# Patient Record
Sex: Female | Born: 1945 | Race: White | Hispanic: No | Marital: Married
Health system: Southern US, Community
[De-identification: ages and names within clinical notes are randomized; demographics above are authoritative.]

---

## 2014-10-04 DIAGNOSIS — I340 Nonrheumatic mitral (valve) insufficiency: Secondary | ICD-10-CM | POA: Diagnosis not present

## 2014-10-04 DIAGNOSIS — I779 Disorder of arteries and arterioles, unspecified: Secondary | ICD-10-CM | POA: Diagnosis not present

## 2014-10-31 DIAGNOSIS — E785 Hyperlipidemia, unspecified: Secondary | ICD-10-CM | POA: Diagnosis not present

## 2014-10-31 DIAGNOSIS — I340 Nonrheumatic mitral (valve) insufficiency: Secondary | ICD-10-CM | POA: Diagnosis not present

## 2014-11-06 DIAGNOSIS — L121 Cicatricial pemphigoid: Secondary | ICD-10-CM | POA: Diagnosis not present

## 2014-11-06 DIAGNOSIS — Z79899 Other long term (current) drug therapy: Secondary | ICD-10-CM | POA: Diagnosis not present

## 2014-11-06 DIAGNOSIS — F17200 Nicotine dependence, unspecified, uncomplicated: Secondary | ICD-10-CM | POA: Diagnosis not present

## 2014-11-29 DIAGNOSIS — I6529 Occlusion and stenosis of unspecified carotid artery: Secondary | ICD-10-CM | POA: Diagnosis not present

## 2014-12-06 DIAGNOSIS — I6523 Occlusion and stenosis of bilateral carotid arteries: Secondary | ICD-10-CM | POA: Diagnosis not present

## 2014-12-06 DIAGNOSIS — Z720 Tobacco use: Secondary | ICD-10-CM | POA: Diagnosis not present

## 2014-12-25 DIAGNOSIS — K5790 Diverticulosis of intestine, part unspecified, without perforation or abscess without bleeding: Secondary | ICD-10-CM | POA: Diagnosis not present

## 2014-12-25 DIAGNOSIS — R748 Abnormal levels of other serum enzymes: Secondary | ICD-10-CM | POA: Diagnosis not present

## 2014-12-25 DIAGNOSIS — R7989 Other specified abnormal findings of blood chemistry: Secondary | ICD-10-CM | POA: Diagnosis not present

## 2014-12-25 DIAGNOSIS — M170 Bilateral primary osteoarthritis of knee: Secondary | ICD-10-CM | POA: Diagnosis not present

## 2014-12-25 DIAGNOSIS — E780 Pure hypercholesterolemia: Secondary | ICD-10-CM | POA: Diagnosis not present

## 2015-05-10 DIAGNOSIS — Z23 Encounter for immunization: Secondary | ICD-10-CM | POA: Diagnosis not present

## 2015-07-10 DIAGNOSIS — Z23 Encounter for immunization: Secondary | ICD-10-CM | POA: Diagnosis not present

## 2015-07-10 DIAGNOSIS — K5790 Diverticulosis of intestine, part unspecified, without perforation or abscess without bleeding: Secondary | ICD-10-CM | POA: Diagnosis not present

## 2015-07-10 DIAGNOSIS — R828 Abnormal findings on cytological and histological examination of urine: Secondary | ICD-10-CM | POA: Diagnosis not present

## 2015-07-10 DIAGNOSIS — Z0000 Encounter for general adult medical examination without abnormal findings: Secondary | ICD-10-CM | POA: Diagnosis not present

## 2015-07-10 DIAGNOSIS — J449 Chronic obstructive pulmonary disease, unspecified: Secondary | ICD-10-CM | POA: Diagnosis not present

## 2015-07-10 DIAGNOSIS — R7989 Other specified abnormal findings of blood chemistry: Secondary | ICD-10-CM | POA: Diagnosis not present

## 2015-07-10 DIAGNOSIS — M899 Disorder of bone, unspecified: Secondary | ICD-10-CM | POA: Diagnosis not present

## 2015-07-10 DIAGNOSIS — R5383 Other fatigue: Secondary | ICD-10-CM | POA: Diagnosis not present

## 2015-07-10 DIAGNOSIS — E7800 Pure hypercholesterolemia, unspecified: Secondary | ICD-10-CM | POA: Diagnosis not present

## 2015-07-10 DIAGNOSIS — M810 Age-related osteoporosis without current pathological fracture: Secondary | ICD-10-CM | POA: Diagnosis not present

## 2015-08-28 DIAGNOSIS — R0902 Hypoxemia: Secondary | ICD-10-CM | POA: Diagnosis not present

## 2015-08-28 DIAGNOSIS — J441 Chronic obstructive pulmonary disease with (acute) exacerbation: Secondary | ICD-10-CM | POA: Diagnosis not present

## 2015-08-28 DIAGNOSIS — J9801 Acute bronchospasm: Secondary | ICD-10-CM | POA: Diagnosis not present

## 2015-08-28 DIAGNOSIS — R0602 Shortness of breath: Secondary | ICD-10-CM | POA: Diagnosis not present

## 2015-08-31 DIAGNOSIS — F17200 Nicotine dependence, unspecified, uncomplicated: Secondary | ICD-10-CM | POA: Diagnosis not present

## 2015-08-31 DIAGNOSIS — Z7951 Long term (current) use of inhaled steroids: Secondary | ICD-10-CM | POA: Diagnosis not present

## 2015-08-31 DIAGNOSIS — F17210 Nicotine dependence, cigarettes, uncomplicated: Secondary | ICD-10-CM | POA: Diagnosis not present

## 2015-08-31 DIAGNOSIS — J9601 Acute respiratory failure with hypoxia: Secondary | ICD-10-CM | POA: Diagnosis not present

## 2015-08-31 DIAGNOSIS — J440 Chronic obstructive pulmonary disease with acute lower respiratory infection: Secondary | ICD-10-CM | POA: Diagnosis not present

## 2015-08-31 DIAGNOSIS — I517 Cardiomegaly: Secondary | ICD-10-CM | POA: Diagnosis not present

## 2015-08-31 DIAGNOSIS — Z90710 Acquired absence of both cervix and uterus: Secondary | ICD-10-CM | POA: Diagnosis not present

## 2015-08-31 DIAGNOSIS — R5381 Other malaise: Secondary | ICD-10-CM | POA: Diagnosis not present

## 2015-08-31 DIAGNOSIS — M109 Gout, unspecified: Secondary | ICD-10-CM | POA: Diagnosis not present

## 2015-08-31 DIAGNOSIS — R739 Hyperglycemia, unspecified: Secondary | ICD-10-CM | POA: Diagnosis not present

## 2015-08-31 DIAGNOSIS — Z7982 Long term (current) use of aspirin: Secondary | ICD-10-CM | POA: Diagnosis not present

## 2015-08-31 DIAGNOSIS — I719 Aortic aneurysm of unspecified site, without rupture: Secondary | ICD-10-CM | POA: Diagnosis not present

## 2015-08-31 DIAGNOSIS — J439 Emphysema, unspecified: Secondary | ICD-10-CM | POA: Diagnosis not present

## 2015-08-31 DIAGNOSIS — Z720 Tobacco use: Secondary | ICD-10-CM | POA: Diagnosis not present

## 2015-08-31 DIAGNOSIS — J9621 Acute and chronic respiratory failure with hypoxia: Secondary | ICD-10-CM | POA: Diagnosis not present

## 2015-08-31 DIAGNOSIS — Z9049 Acquired absence of other specified parts of digestive tract: Secondary | ICD-10-CM | POA: Diagnosis not present

## 2015-08-31 DIAGNOSIS — Z9981 Dependence on supplemental oxygen: Secondary | ICD-10-CM | POA: Diagnosis not present

## 2015-08-31 DIAGNOSIS — I272 Other secondary pulmonary hypertension: Secondary | ICD-10-CM | POA: Diagnosis not present

## 2015-08-31 DIAGNOSIS — J449 Chronic obstructive pulmonary disease, unspecified: Secondary | ICD-10-CM | POA: Diagnosis not present

## 2015-08-31 DIAGNOSIS — Z7952 Long term (current) use of systemic steroids: Secondary | ICD-10-CM | POA: Diagnosis not present

## 2015-08-31 DIAGNOSIS — R0600 Dyspnea, unspecified: Secondary | ICD-10-CM | POA: Diagnosis not present

## 2015-08-31 DIAGNOSIS — E785 Hyperlipidemia, unspecified: Secondary | ICD-10-CM | POA: Diagnosis not present

## 2015-08-31 DIAGNOSIS — I5031 Acute diastolic (congestive) heart failure: Secondary | ICD-10-CM | POA: Diagnosis not present

## 2015-08-31 DIAGNOSIS — J441 Chronic obstructive pulmonary disease with (acute) exacerbation: Secondary | ICD-10-CM | POA: Diagnosis not present

## 2015-08-31 DIAGNOSIS — I5020 Unspecified systolic (congestive) heart failure: Secondary | ICD-10-CM | POA: Diagnosis not present

## 2015-08-31 DIAGNOSIS — J40 Bronchitis, not specified as acute or chronic: Secondary | ICD-10-CM | POA: Diagnosis not present

## 2015-08-31 DIAGNOSIS — R069 Unspecified abnormalities of breathing: Secondary | ICD-10-CM | POA: Diagnosis not present

## 2015-08-31 DIAGNOSIS — I081 Rheumatic disorders of both mitral and tricuspid valves: Secondary | ICD-10-CM | POA: Diagnosis not present

## 2015-08-31 DIAGNOSIS — J189 Pneumonia, unspecified organism: Secondary | ICD-10-CM | POA: Diagnosis not present

## 2015-08-31 DIAGNOSIS — I779 Disorder of arteries and arterioles, unspecified: Secondary | ICD-10-CM | POA: Diagnosis not present

## 2015-08-31 DIAGNOSIS — R0602 Shortness of breath: Secondary | ICD-10-CM | POA: Diagnosis not present

## 2015-08-31 DIAGNOSIS — J431 Panlobular emphysema: Secondary | ICD-10-CM | POA: Diagnosis not present

## 2015-08-31 DIAGNOSIS — J9600 Acute respiratory failure, unspecified whether with hypoxia or hypercapnia: Secondary | ICD-10-CM | POA: Diagnosis not present

## 2015-09-06 DIAGNOSIS — M109 Gout, unspecified: Secondary | ICD-10-CM | POA: Diagnosis not present

## 2015-09-06 DIAGNOSIS — Z792 Long term (current) use of antibiotics: Secondary | ICD-10-CM | POA: Diagnosis not present

## 2015-09-06 DIAGNOSIS — I509 Heart failure, unspecified: Secondary | ICD-10-CM | POA: Diagnosis not present

## 2015-09-06 DIAGNOSIS — J441 Chronic obstructive pulmonary disease with (acute) exacerbation: Secondary | ICD-10-CM | POA: Diagnosis not present

## 2015-09-06 DIAGNOSIS — J440 Chronic obstructive pulmonary disease with acute lower respiratory infection: Secondary | ICD-10-CM | POA: Diagnosis not present

## 2015-09-06 DIAGNOSIS — I719 Aortic aneurysm of unspecified site, without rupture: Secondary | ICD-10-CM | POA: Diagnosis not present

## 2015-09-06 DIAGNOSIS — I272 Other secondary pulmonary hypertension: Secondary | ICD-10-CM | POA: Diagnosis not present

## 2015-09-06 DIAGNOSIS — J189 Pneumonia, unspecified organism: Secondary | ICD-10-CM | POA: Diagnosis not present

## 2015-09-06 DIAGNOSIS — Z9981 Dependence on supplemental oxygen: Secondary | ICD-10-CM | POA: Diagnosis not present

## 2015-09-06 DIAGNOSIS — Z7952 Long term (current) use of systemic steroids: Secondary | ICD-10-CM | POA: Diagnosis not present

## 2015-09-11 DIAGNOSIS — Z7952 Long term (current) use of systemic steroids: Secondary | ICD-10-CM | POA: Diagnosis not present

## 2015-09-11 DIAGNOSIS — M109 Gout, unspecified: Secondary | ICD-10-CM | POA: Diagnosis not present

## 2015-09-11 DIAGNOSIS — Z9981 Dependence on supplemental oxygen: Secondary | ICD-10-CM | POA: Diagnosis not present

## 2015-09-11 DIAGNOSIS — I272 Other secondary pulmonary hypertension: Secondary | ICD-10-CM | POA: Diagnosis not present

## 2015-09-11 DIAGNOSIS — J440 Chronic obstructive pulmonary disease with acute lower respiratory infection: Secondary | ICD-10-CM | POA: Diagnosis not present

## 2015-09-11 DIAGNOSIS — J441 Chronic obstructive pulmonary disease with (acute) exacerbation: Secondary | ICD-10-CM | POA: Diagnosis not present

## 2015-09-11 DIAGNOSIS — R5381 Other malaise: Secondary | ICD-10-CM | POA: Diagnosis not present

## 2015-09-11 DIAGNOSIS — J449 Chronic obstructive pulmonary disease, unspecified: Secondary | ICD-10-CM | POA: Diagnosis not present

## 2015-09-11 DIAGNOSIS — J189 Pneumonia, unspecified organism: Secondary | ICD-10-CM | POA: Diagnosis not present

## 2015-09-11 DIAGNOSIS — Z792 Long term (current) use of antibiotics: Secondary | ICD-10-CM | POA: Diagnosis not present

## 2015-09-11 DIAGNOSIS — I509 Heart failure, unspecified: Secondary | ICD-10-CM | POA: Diagnosis not present

## 2015-09-11 DIAGNOSIS — I719 Aortic aneurysm of unspecified site, without rupture: Secondary | ICD-10-CM | POA: Diagnosis not present

## 2015-09-13 DIAGNOSIS — Z9981 Dependence on supplemental oxygen: Secondary | ICD-10-CM | POA: Diagnosis not present

## 2015-09-13 DIAGNOSIS — R609 Edema, unspecified: Secondary | ICD-10-CM | POA: Diagnosis not present

## 2015-09-13 DIAGNOSIS — I272 Other secondary pulmonary hypertension: Secondary | ICD-10-CM | POA: Diagnosis not present

## 2015-09-13 DIAGNOSIS — J440 Chronic obstructive pulmonary disease with acute lower respiratory infection: Secondary | ICD-10-CM | POA: Diagnosis not present

## 2015-09-13 DIAGNOSIS — I509 Heart failure, unspecified: Secondary | ICD-10-CM | POA: Diagnosis not present

## 2015-09-13 DIAGNOSIS — I719 Aortic aneurysm of unspecified site, without rupture: Secondary | ICD-10-CM | POA: Diagnosis not present

## 2015-09-13 DIAGNOSIS — J189 Pneumonia, unspecified organism: Secondary | ICD-10-CM | POA: Diagnosis not present

## 2015-09-13 DIAGNOSIS — Z792 Long term (current) use of antibiotics: Secondary | ICD-10-CM | POA: Diagnosis not present

## 2015-09-13 DIAGNOSIS — J441 Chronic obstructive pulmonary disease with (acute) exacerbation: Secondary | ICD-10-CM | POA: Diagnosis not present

## 2015-09-13 DIAGNOSIS — Z7952 Long term (current) use of systemic steroids: Secondary | ICD-10-CM | POA: Diagnosis not present

## 2015-09-13 DIAGNOSIS — I340 Nonrheumatic mitral (valve) insufficiency: Secondary | ICD-10-CM | POA: Diagnosis not present

## 2015-09-13 DIAGNOSIS — M109 Gout, unspecified: Secondary | ICD-10-CM | POA: Diagnosis not present

## 2015-09-13 DIAGNOSIS — J439 Emphysema, unspecified: Secondary | ICD-10-CM | POA: Diagnosis not present

## 2015-09-18 DIAGNOSIS — I272 Other secondary pulmonary hypertension: Secondary | ICD-10-CM | POA: Diagnosis not present

## 2015-09-18 DIAGNOSIS — I719 Aortic aneurysm of unspecified site, without rupture: Secondary | ICD-10-CM | POA: Diagnosis not present

## 2015-09-18 DIAGNOSIS — J440 Chronic obstructive pulmonary disease with acute lower respiratory infection: Secondary | ICD-10-CM | POA: Diagnosis not present

## 2015-09-18 DIAGNOSIS — I509 Heart failure, unspecified: Secondary | ICD-10-CM | POA: Diagnosis not present

## 2015-09-18 DIAGNOSIS — M109 Gout, unspecified: Secondary | ICD-10-CM | POA: Diagnosis not present

## 2015-09-18 DIAGNOSIS — J441 Chronic obstructive pulmonary disease with (acute) exacerbation: Secondary | ICD-10-CM | POA: Diagnosis not present

## 2015-09-18 DIAGNOSIS — J189 Pneumonia, unspecified organism: Secondary | ICD-10-CM | POA: Diagnosis not present

## 2015-09-18 DIAGNOSIS — Z792 Long term (current) use of antibiotics: Secondary | ICD-10-CM | POA: Diagnosis not present

## 2015-09-18 DIAGNOSIS — Z7952 Long term (current) use of systemic steroids: Secondary | ICD-10-CM | POA: Diagnosis not present

## 2015-09-18 DIAGNOSIS — Z9981 Dependence on supplemental oxygen: Secondary | ICD-10-CM | POA: Diagnosis not present

## 2015-09-19 DIAGNOSIS — J439 Emphysema, unspecified: Secondary | ICD-10-CM | POA: Diagnosis not present

## 2015-09-19 DIAGNOSIS — J449 Chronic obstructive pulmonary disease, unspecified: Secondary | ICD-10-CM | POA: Diagnosis not present

## 2015-09-19 DIAGNOSIS — M7989 Other specified soft tissue disorders: Secondary | ICD-10-CM | POA: Diagnosis not present

## 2015-09-19 DIAGNOSIS — I340 Nonrheumatic mitral (valve) insufficiency: Secondary | ICD-10-CM | POA: Diagnosis not present

## 2015-09-19 DIAGNOSIS — R600 Localized edema: Secondary | ICD-10-CM | POA: Diagnosis not present

## 2015-09-19 DIAGNOSIS — I6529 Occlusion and stenosis of unspecified carotid artery: Secondary | ICD-10-CM | POA: Diagnosis not present

## 2015-09-19 DIAGNOSIS — I719 Aortic aneurysm of unspecified site, without rupture: Secondary | ICD-10-CM | POA: Diagnosis not present

## 2015-09-19 DIAGNOSIS — Z87891 Personal history of nicotine dependence: Secondary | ICD-10-CM | POA: Diagnosis not present

## 2015-09-19 DIAGNOSIS — J9611 Chronic respiratory failure with hypoxia: Secondary | ICD-10-CM | POA: Diagnosis not present

## 2015-09-20 DIAGNOSIS — J440 Chronic obstructive pulmonary disease with acute lower respiratory infection: Secondary | ICD-10-CM | POA: Diagnosis not present

## 2015-09-20 DIAGNOSIS — I719 Aortic aneurysm of unspecified site, without rupture: Secondary | ICD-10-CM | POA: Diagnosis not present

## 2015-09-20 DIAGNOSIS — I272 Other secondary pulmonary hypertension: Secondary | ICD-10-CM | POA: Diagnosis not present

## 2015-09-20 DIAGNOSIS — J189 Pneumonia, unspecified organism: Secondary | ICD-10-CM | POA: Diagnosis not present

## 2015-09-20 DIAGNOSIS — Z9981 Dependence on supplemental oxygen: Secondary | ICD-10-CM | POA: Diagnosis not present

## 2015-09-20 DIAGNOSIS — M109 Gout, unspecified: Secondary | ICD-10-CM | POA: Diagnosis not present

## 2015-09-20 DIAGNOSIS — I509 Heart failure, unspecified: Secondary | ICD-10-CM | POA: Diagnosis not present

## 2015-09-20 DIAGNOSIS — Z792 Long term (current) use of antibiotics: Secondary | ICD-10-CM | POA: Diagnosis not present

## 2015-09-20 DIAGNOSIS — Z7952 Long term (current) use of systemic steroids: Secondary | ICD-10-CM | POA: Diagnosis not present

## 2015-09-20 DIAGNOSIS — J441 Chronic obstructive pulmonary disease with (acute) exacerbation: Secondary | ICD-10-CM | POA: Diagnosis not present

## 2015-09-21 DIAGNOSIS — R600 Localized edema: Secondary | ICD-10-CM | POA: Diagnosis not present

## 2015-09-27 DIAGNOSIS — R222 Localized swelling, mass and lump, trunk: Secondary | ICD-10-CM | POA: Diagnosis not present

## 2015-09-27 DIAGNOSIS — D649 Anemia, unspecified: Secondary | ICD-10-CM | POA: Diagnosis not present

## 2015-09-27 DIAGNOSIS — I340 Nonrheumatic mitral (valve) insufficiency: Secondary | ICD-10-CM | POA: Diagnosis not present

## 2015-09-27 DIAGNOSIS — R600 Localized edema: Secondary | ICD-10-CM | POA: Diagnosis not present

## 2015-09-27 DIAGNOSIS — I272 Other secondary pulmonary hypertension: Secondary | ICD-10-CM | POA: Diagnosis not present

## 2015-09-27 DIAGNOSIS — R0902 Hypoxemia: Secondary | ICD-10-CM | POA: Diagnosis not present

## 2015-09-27 DIAGNOSIS — J449 Chronic obstructive pulmonary disease, unspecified: Secondary | ICD-10-CM | POA: Diagnosis not present

## 2015-10-02 DIAGNOSIS — I340 Nonrheumatic mitral (valve) insufficiency: Secondary | ICD-10-CM | POA: Diagnosis not present

## 2015-10-02 DIAGNOSIS — J449 Chronic obstructive pulmonary disease, unspecified: Secondary | ICD-10-CM | POA: Diagnosis not present

## 2015-10-02 DIAGNOSIS — R918 Other nonspecific abnormal finding of lung field: Secondary | ICD-10-CM | POA: Diagnosis not present

## 2015-10-02 DIAGNOSIS — J90 Pleural effusion, not elsewhere classified: Secondary | ICD-10-CM | POA: Diagnosis not present

## 2015-10-02 DIAGNOSIS — I714 Abdominal aortic aneurysm, without rupture: Secondary | ICD-10-CM | POA: Diagnosis not present

## 2015-10-02 DIAGNOSIS — R600 Localized edema: Secondary | ICD-10-CM | POA: Diagnosis not present

## 2015-10-02 DIAGNOSIS — R0609 Other forms of dyspnea: Secondary | ICD-10-CM | POA: Diagnosis not present

## 2015-10-05 DIAGNOSIS — I719 Aortic aneurysm of unspecified site, without rupture: Secondary | ICD-10-CM | POA: Diagnosis not present

## 2015-10-05 DIAGNOSIS — I509 Heart failure, unspecified: Secondary | ICD-10-CM | POA: Diagnosis not present

## 2015-10-05 DIAGNOSIS — J441 Chronic obstructive pulmonary disease with (acute) exacerbation: Secondary | ICD-10-CM | POA: Diagnosis not present

## 2015-10-05 DIAGNOSIS — J440 Chronic obstructive pulmonary disease with acute lower respiratory infection: Secondary | ICD-10-CM | POA: Diagnosis not present

## 2015-10-05 DIAGNOSIS — J189 Pneumonia, unspecified organism: Secondary | ICD-10-CM | POA: Diagnosis not present

## 2015-10-05 DIAGNOSIS — I272 Other secondary pulmonary hypertension: Secondary | ICD-10-CM | POA: Diagnosis not present

## 2015-10-05 DIAGNOSIS — Z9981 Dependence on supplemental oxygen: Secondary | ICD-10-CM | POA: Diagnosis not present

## 2015-10-05 DIAGNOSIS — M109 Gout, unspecified: Secondary | ICD-10-CM | POA: Diagnosis not present

## 2015-10-05 DIAGNOSIS — Z792 Long term (current) use of antibiotics: Secondary | ICD-10-CM | POA: Diagnosis not present

## 2015-10-05 DIAGNOSIS — Z7952 Long term (current) use of systemic steroids: Secondary | ICD-10-CM | POA: Diagnosis not present

## 2015-10-05 DIAGNOSIS — J449 Chronic obstructive pulmonary disease, unspecified: Secondary | ICD-10-CM | POA: Diagnosis not present

## 2015-10-08 DIAGNOSIS — M109 Gout, unspecified: Secondary | ICD-10-CM | POA: Diagnosis not present

## 2015-10-08 DIAGNOSIS — Z792 Long term (current) use of antibiotics: Secondary | ICD-10-CM | POA: Diagnosis not present

## 2015-10-08 DIAGNOSIS — I509 Heart failure, unspecified: Secondary | ICD-10-CM | POA: Diagnosis not present

## 2015-10-08 DIAGNOSIS — J189 Pneumonia, unspecified organism: Secondary | ICD-10-CM | POA: Diagnosis not present

## 2015-10-08 DIAGNOSIS — Z7952 Long term (current) use of systemic steroids: Secondary | ICD-10-CM | POA: Diagnosis not present

## 2015-10-08 DIAGNOSIS — I272 Other secondary pulmonary hypertension: Secondary | ICD-10-CM | POA: Diagnosis not present

## 2015-10-08 DIAGNOSIS — I719 Aortic aneurysm of unspecified site, without rupture: Secondary | ICD-10-CM | POA: Diagnosis not present

## 2015-10-08 DIAGNOSIS — Z9981 Dependence on supplemental oxygen: Secondary | ICD-10-CM | POA: Diagnosis not present

## 2015-10-08 DIAGNOSIS — J441 Chronic obstructive pulmonary disease with (acute) exacerbation: Secondary | ICD-10-CM | POA: Diagnosis not present

## 2015-10-08 DIAGNOSIS — J440 Chronic obstructive pulmonary disease with acute lower respiratory infection: Secondary | ICD-10-CM | POA: Diagnosis not present

## 2015-10-17 DIAGNOSIS — J9611 Chronic respiratory failure with hypoxia: Secondary | ICD-10-CM | POA: Diagnosis not present

## 2015-10-17 DIAGNOSIS — J449 Chronic obstructive pulmonary disease, unspecified: Secondary | ICD-10-CM | POA: Diagnosis not present

## 2015-10-18 DIAGNOSIS — I272 Other secondary pulmonary hypertension: Secondary | ICD-10-CM | POA: Diagnosis not present

## 2015-10-18 DIAGNOSIS — J441 Chronic obstructive pulmonary disease with (acute) exacerbation: Secondary | ICD-10-CM | POA: Diagnosis not present

## 2015-10-18 DIAGNOSIS — Z9981 Dependence on supplemental oxygen: Secondary | ICD-10-CM | POA: Diagnosis not present

## 2015-10-18 DIAGNOSIS — J440 Chronic obstructive pulmonary disease with acute lower respiratory infection: Secondary | ICD-10-CM | POA: Diagnosis not present

## 2015-10-18 DIAGNOSIS — I509 Heart failure, unspecified: Secondary | ICD-10-CM | POA: Diagnosis not present

## 2015-10-18 DIAGNOSIS — J189 Pneumonia, unspecified organism: Secondary | ICD-10-CM | POA: Diagnosis not present

## 2015-10-18 DIAGNOSIS — M109 Gout, unspecified: Secondary | ICD-10-CM | POA: Diagnosis not present

## 2015-10-18 DIAGNOSIS — Z7952 Long term (current) use of systemic steroids: Secondary | ICD-10-CM | POA: Diagnosis not present

## 2015-10-18 DIAGNOSIS — Z792 Long term (current) use of antibiotics: Secondary | ICD-10-CM | POA: Diagnosis not present

## 2015-10-18 DIAGNOSIS — I719 Aortic aneurysm of unspecified site, without rupture: Secondary | ICD-10-CM | POA: Diagnosis not present

## 2015-10-26 DIAGNOSIS — Z7952 Long term (current) use of systemic steroids: Secondary | ICD-10-CM | POA: Diagnosis not present

## 2015-10-26 DIAGNOSIS — Z792 Long term (current) use of antibiotics: Secondary | ICD-10-CM | POA: Diagnosis not present

## 2015-10-26 DIAGNOSIS — I272 Other secondary pulmonary hypertension: Secondary | ICD-10-CM | POA: Diagnosis not present

## 2015-10-26 DIAGNOSIS — Z9981 Dependence on supplemental oxygen: Secondary | ICD-10-CM | POA: Diagnosis not present

## 2015-10-26 DIAGNOSIS — J189 Pneumonia, unspecified organism: Secondary | ICD-10-CM | POA: Diagnosis not present

## 2015-10-26 DIAGNOSIS — J441 Chronic obstructive pulmonary disease with (acute) exacerbation: Secondary | ICD-10-CM | POA: Diagnosis not present

## 2015-10-26 DIAGNOSIS — I509 Heart failure, unspecified: Secondary | ICD-10-CM | POA: Diagnosis not present

## 2015-10-26 DIAGNOSIS — M109 Gout, unspecified: Secondary | ICD-10-CM | POA: Diagnosis not present

## 2015-10-26 DIAGNOSIS — J440 Chronic obstructive pulmonary disease with acute lower respiratory infection: Secondary | ICD-10-CM | POA: Diagnosis not present

## 2015-10-26 DIAGNOSIS — I719 Aortic aneurysm of unspecified site, without rupture: Secondary | ICD-10-CM | POA: Diagnosis not present

## 2015-10-30 DIAGNOSIS — I272 Other secondary pulmonary hypertension: Secondary | ICD-10-CM | POA: Diagnosis not present

## 2015-10-30 DIAGNOSIS — J440 Chronic obstructive pulmonary disease with acute lower respiratory infection: Secondary | ICD-10-CM | POA: Diagnosis not present

## 2015-10-30 DIAGNOSIS — I719 Aortic aneurysm of unspecified site, without rupture: Secondary | ICD-10-CM | POA: Diagnosis not present

## 2015-10-30 DIAGNOSIS — I509 Heart failure, unspecified: Secondary | ICD-10-CM | POA: Diagnosis not present

## 2015-10-30 DIAGNOSIS — Z7952 Long term (current) use of systemic steroids: Secondary | ICD-10-CM | POA: Diagnosis not present

## 2015-10-30 DIAGNOSIS — Z9981 Dependence on supplemental oxygen: Secondary | ICD-10-CM | POA: Diagnosis not present

## 2015-10-30 DIAGNOSIS — J441 Chronic obstructive pulmonary disease with (acute) exacerbation: Secondary | ICD-10-CM | POA: Diagnosis not present

## 2015-10-30 DIAGNOSIS — M109 Gout, unspecified: Secondary | ICD-10-CM | POA: Diagnosis not present

## 2015-10-30 DIAGNOSIS — J189 Pneumonia, unspecified organism: Secondary | ICD-10-CM | POA: Diagnosis not present

## 2015-10-30 DIAGNOSIS — Z792 Long term (current) use of antibiotics: Secondary | ICD-10-CM | POA: Diagnosis not present

## 2015-10-31 DIAGNOSIS — I340 Nonrheumatic mitral (valve) insufficiency: Secondary | ICD-10-CM | POA: Diagnosis not present

## 2015-10-31 DIAGNOSIS — I272 Other secondary pulmonary hypertension: Secondary | ICD-10-CM | POA: Diagnosis not present

## 2015-10-31 DIAGNOSIS — J439 Emphysema, unspecified: Secondary | ICD-10-CM | POA: Diagnosis not present

## 2015-10-31 DIAGNOSIS — I6529 Occlusion and stenosis of unspecified carotid artery: Secondary | ICD-10-CM | POA: Diagnosis not present

## 2015-10-31 DIAGNOSIS — I712 Thoracic aortic aneurysm, without rupture: Secondary | ICD-10-CM | POA: Diagnosis not present

## 2015-11-01 DIAGNOSIS — R0602 Shortness of breath: Secondary | ICD-10-CM | POA: Diagnosis not present

## 2015-11-01 DIAGNOSIS — I6529 Occlusion and stenosis of unspecified carotid artery: Secondary | ICD-10-CM | POA: Diagnosis not present

## 2015-11-02 DIAGNOSIS — J449 Chronic obstructive pulmonary disease, unspecified: Secondary | ICD-10-CM | POA: Diagnosis not present

## 2015-11-23 DIAGNOSIS — J449 Chronic obstructive pulmonary disease, unspecified: Secondary | ICD-10-CM | POA: Diagnosis not present

## 2015-11-23 DIAGNOSIS — J90 Pleural effusion, not elsewhere classified: Secondary | ICD-10-CM | POA: Diagnosis not present

## 2015-11-23 DIAGNOSIS — J9611 Chronic respiratory failure with hypoxia: Secondary | ICD-10-CM | POA: Diagnosis not present

## 2015-11-23 DIAGNOSIS — J984 Other disorders of lung: Secondary | ICD-10-CM | POA: Diagnosis not present

## 2015-11-27 DIAGNOSIS — R5383 Other fatigue: Secondary | ICD-10-CM | POA: Diagnosis not present

## 2015-11-27 DIAGNOSIS — R600 Localized edema: Secondary | ICD-10-CM | POA: Diagnosis not present

## 2015-11-27 DIAGNOSIS — R0902 Hypoxemia: Secondary | ICD-10-CM | POA: Diagnosis not present

## 2015-11-27 DIAGNOSIS — D649 Anemia, unspecified: Secondary | ICD-10-CM | POA: Diagnosis not present

## 2015-11-27 DIAGNOSIS — M8580 Other specified disorders of bone density and structure, unspecified site: Secondary | ICD-10-CM | POA: Diagnosis not present

## 2015-11-27 DIAGNOSIS — G4730 Sleep apnea, unspecified: Secondary | ICD-10-CM | POA: Diagnosis not present

## 2015-11-27 DIAGNOSIS — I6529 Occlusion and stenosis of unspecified carotid artery: Secondary | ICD-10-CM | POA: Diagnosis not present

## 2015-11-28 DIAGNOSIS — I6523 Occlusion and stenosis of bilateral carotid arteries: Secondary | ICD-10-CM | POA: Diagnosis not present

## 2015-12-03 DIAGNOSIS — J449 Chronic obstructive pulmonary disease, unspecified: Secondary | ICD-10-CM | POA: Diagnosis not present

## 2015-12-05 DIAGNOSIS — I272 Other secondary pulmonary hypertension: Secondary | ICD-10-CM | POA: Diagnosis not present

## 2015-12-05 DIAGNOSIS — G4733 Obstructive sleep apnea (adult) (pediatric): Secondary | ICD-10-CM | POA: Diagnosis not present

## 2015-12-05 DIAGNOSIS — I340 Nonrheumatic mitral (valve) insufficiency: Secondary | ICD-10-CM | POA: Diagnosis not present

## 2015-12-05 DIAGNOSIS — I7300 Raynaud's syndrome without gangrene: Secondary | ICD-10-CM | POA: Diagnosis not present

## 2015-12-05 DIAGNOSIS — J9611 Chronic respiratory failure with hypoxia: Secondary | ICD-10-CM | POA: Diagnosis not present

## 2015-12-05 DIAGNOSIS — J449 Chronic obstructive pulmonary disease, unspecified: Secondary | ICD-10-CM | POA: Diagnosis not present

## 2015-12-05 DIAGNOSIS — R0602 Shortness of breath: Secondary | ICD-10-CM | POA: Diagnosis not present

## 2015-12-12 DIAGNOSIS — I6523 Occlusion and stenosis of bilateral carotid arteries: Secondary | ICD-10-CM | POA: Diagnosis not present

## 2016-01-02 DIAGNOSIS — J449 Chronic obstructive pulmonary disease, unspecified: Secondary | ICD-10-CM | POA: Diagnosis not present

## 2016-01-09 DIAGNOSIS — D649 Anemia, unspecified: Secondary | ICD-10-CM | POA: Diagnosis not present

## 2016-01-09 DIAGNOSIS — E7800 Pure hypercholesterolemia, unspecified: Secondary | ICD-10-CM | POA: Diagnosis not present

## 2016-01-09 DIAGNOSIS — M810 Age-related osteoporosis without current pathological fracture: Secondary | ICD-10-CM | POA: Diagnosis not present

## 2016-01-09 DIAGNOSIS — J449 Chronic obstructive pulmonary disease, unspecified: Secondary | ICD-10-CM | POA: Diagnosis not present

## 2016-01-16 DIAGNOSIS — I272 Other secondary pulmonary hypertension: Secondary | ICD-10-CM | POA: Diagnosis not present

## 2016-01-16 DIAGNOSIS — G4733 Obstructive sleep apnea (adult) (pediatric): Secondary | ICD-10-CM | POA: Diagnosis not present

## 2016-01-16 DIAGNOSIS — J9611 Chronic respiratory failure with hypoxia: Secondary | ICD-10-CM | POA: Diagnosis not present

## 2016-01-16 DIAGNOSIS — Z720 Tobacco use: Secondary | ICD-10-CM | POA: Diagnosis not present

## 2016-01-16 DIAGNOSIS — J449 Chronic obstructive pulmonary disease, unspecified: Secondary | ICD-10-CM | POA: Diagnosis not present

## 2016-01-21 DIAGNOSIS — J441 Chronic obstructive pulmonary disease with (acute) exacerbation: Secondary | ICD-10-CM | POA: Diagnosis not present

## 2016-01-21 DIAGNOSIS — Z87891 Personal history of nicotine dependence: Secondary | ICD-10-CM | POA: Diagnosis not present

## 2016-01-21 DIAGNOSIS — E785 Hyperlipidemia, unspecified: Secondary | ICD-10-CM | POA: Diagnosis not present

## 2016-01-21 DIAGNOSIS — L121 Cicatricial pemphigoid: Secondary | ICD-10-CM | POA: Diagnosis not present

## 2016-01-21 DIAGNOSIS — I272 Other secondary pulmonary hypertension: Secondary | ICD-10-CM | POA: Diagnosis not present

## 2016-01-21 DIAGNOSIS — Z79899 Other long term (current) drug therapy: Secondary | ICD-10-CM | POA: Diagnosis not present

## 2016-01-21 DIAGNOSIS — J449 Chronic obstructive pulmonary disease, unspecified: Secondary | ICD-10-CM | POA: Diagnosis not present

## 2016-02-02 DIAGNOSIS — J449 Chronic obstructive pulmonary disease, unspecified: Secondary | ICD-10-CM | POA: Diagnosis not present

## 2016-02-26 DIAGNOSIS — I340 Nonrheumatic mitral (valve) insufficiency: Secondary | ICD-10-CM | POA: Diagnosis not present

## 2016-02-26 DIAGNOSIS — I272 Other secondary pulmonary hypertension: Secondary | ICD-10-CM | POA: Diagnosis not present

## 2016-02-26 DIAGNOSIS — I712 Thoracic aortic aneurysm, without rupture: Secondary | ICD-10-CM | POA: Diagnosis not present

## 2016-02-26 DIAGNOSIS — J439 Emphysema, unspecified: Secondary | ICD-10-CM | POA: Diagnosis not present

## 2016-02-26 DIAGNOSIS — I6529 Occlusion and stenosis of unspecified carotid artery: Secondary | ICD-10-CM | POA: Diagnosis not present

## 2016-03-03 DIAGNOSIS — J449 Chronic obstructive pulmonary disease, unspecified: Secondary | ICD-10-CM | POA: Diagnosis not present

## 2016-03-10 DIAGNOSIS — R768 Other specified abnormal immunological findings in serum: Secondary | ICD-10-CM | POA: Diagnosis not present

## 2016-03-10 DIAGNOSIS — I272 Other secondary pulmonary hypertension: Secondary | ICD-10-CM | POA: Diagnosis not present

## 2016-04-03 DIAGNOSIS — J449 Chronic obstructive pulmonary disease, unspecified: Secondary | ICD-10-CM | POA: Diagnosis not present

## 2016-05-04 DIAGNOSIS — J449 Chronic obstructive pulmonary disease, unspecified: Secondary | ICD-10-CM | POA: Diagnosis not present

## 2016-05-06 DIAGNOSIS — J449 Chronic obstructive pulmonary disease, unspecified: Secondary | ICD-10-CM | POA: Diagnosis not present

## 2016-05-06 DIAGNOSIS — Z720 Tobacco use: Secondary | ICD-10-CM | POA: Diagnosis not present

## 2016-05-06 DIAGNOSIS — J439 Emphysema, unspecified: Secondary | ICD-10-CM | POA: Diagnosis not present

## 2016-05-06 DIAGNOSIS — I272 Other secondary pulmonary hypertension: Secondary | ICD-10-CM | POA: Diagnosis not present

## 2016-06-03 DIAGNOSIS — J449 Chronic obstructive pulmonary disease, unspecified: Secondary | ICD-10-CM | POA: Diagnosis not present

## 2016-07-04 DIAGNOSIS — J449 Chronic obstructive pulmonary disease, unspecified: Secondary | ICD-10-CM | POA: Diagnosis not present

## 2016-07-14 ENCOUNTER — Other Ambulatory Visit: Payer: Self-pay | Admitting: Unknown Physician Specialty

## 2016-07-14 DIAGNOSIS — R109 Unspecified abdominal pain: Secondary | ICD-10-CM

## 2016-07-15 ENCOUNTER — Ambulatory Visit (INDEPENDENT_AMBULATORY_CARE_PROVIDER_SITE_OTHER): Payer: Medicare Other

## 2016-07-15 DIAGNOSIS — R109 Unspecified abdominal pain: Secondary | ICD-10-CM

## 2016-07-15 DIAGNOSIS — K5900 Constipation, unspecified: Secondary | ICD-10-CM | POA: Diagnosis not present

## 2016-07-15 DIAGNOSIS — I700 Atherosclerosis of aorta: Secondary | ICD-10-CM | POA: Diagnosis not present

## 2016-07-15 DIAGNOSIS — R140 Abdominal distension (gaseous): Secondary | ICD-10-CM | POA: Diagnosis not present

## 2016-07-15 DIAGNOSIS — J449 Chronic obstructive pulmonary disease, unspecified: Secondary | ICD-10-CM | POA: Diagnosis not present

## 2016-08-01 DIAGNOSIS — R828 Abnormal findings on cytological and histological examination of urine: Secondary | ICD-10-CM | POA: Diagnosis not present

## 2016-08-01 DIAGNOSIS — K5790 Diverticulosis of intestine, part unspecified, without perforation or abscess without bleeding: Secondary | ICD-10-CM | POA: Diagnosis not present

## 2016-08-01 DIAGNOSIS — J449 Chronic obstructive pulmonary disease, unspecified: Secondary | ICD-10-CM | POA: Diagnosis not present

## 2016-08-01 DIAGNOSIS — M810 Age-related osteoporosis without current pathological fracture: Secondary | ICD-10-CM | POA: Diagnosis not present

## 2016-08-01 DIAGNOSIS — Z0000 Encounter for general adult medical examination without abnormal findings: Secondary | ICD-10-CM | POA: Diagnosis not present

## 2016-08-01 DIAGNOSIS — M899 Disorder of bone, unspecified: Secondary | ICD-10-CM | POA: Diagnosis not present

## 2016-08-01 DIAGNOSIS — I6529 Occlusion and stenosis of unspecified carotid artery: Secondary | ICD-10-CM | POA: Diagnosis not present

## 2016-08-03 DIAGNOSIS — J449 Chronic obstructive pulmonary disease, unspecified: Secondary | ICD-10-CM | POA: Diagnosis not present

## 2016-08-05 DIAGNOSIS — J9611 Chronic respiratory failure with hypoxia: Secondary | ICD-10-CM | POA: Diagnosis not present

## 2016-08-05 DIAGNOSIS — J441 Chronic obstructive pulmonary disease with (acute) exacerbation: Secondary | ICD-10-CM | POA: Diagnosis not present

## 2016-08-05 DIAGNOSIS — J449 Chronic obstructive pulmonary disease, unspecified: Secondary | ICD-10-CM | POA: Diagnosis not present

## 2016-08-05 DIAGNOSIS — Z720 Tobacco use: Secondary | ICD-10-CM | POA: Diagnosis not present

## 2016-08-05 DIAGNOSIS — I272 Pulmonary hypertension, unspecified: Secondary | ICD-10-CM | POA: Diagnosis not present

## 2016-08-20 DIAGNOSIS — Z9981 Dependence on supplemental oxygen: Secondary | ICD-10-CM | POA: Diagnosis not present

## 2016-08-20 DIAGNOSIS — Z7982 Long term (current) use of aspirin: Secondary | ICD-10-CM | POA: Diagnosis not present

## 2016-08-20 DIAGNOSIS — J432 Centrilobular emphysema: Secondary | ICD-10-CM | POA: Diagnosis not present

## 2016-08-20 DIAGNOSIS — J180 Bronchopneumonia, unspecified organism: Secondary | ICD-10-CM | POA: Diagnosis not present

## 2016-08-20 DIAGNOSIS — Z79899 Other long term (current) drug therapy: Secondary | ICD-10-CM | POA: Diagnosis not present

## 2016-08-20 DIAGNOSIS — R7881 Bacteremia: Secondary | ICD-10-CM | POA: Diagnosis not present

## 2016-08-20 DIAGNOSIS — J9621 Acute and chronic respiratory failure with hypoxia: Secondary | ICD-10-CM | POA: Diagnosis not present

## 2016-08-20 DIAGNOSIS — I714 Abdominal aortic aneurysm, without rupture: Secondary | ICD-10-CM | POA: Diagnosis not present

## 2016-08-20 DIAGNOSIS — R05 Cough: Secondary | ICD-10-CM | POA: Diagnosis not present

## 2016-08-20 DIAGNOSIS — R0902 Hypoxemia: Secondary | ICD-10-CM | POA: Diagnosis not present

## 2016-08-20 DIAGNOSIS — J440 Chronic obstructive pulmonary disease with acute lower respiratory infection: Secondary | ICD-10-CM | POA: Diagnosis not present

## 2016-08-20 DIAGNOSIS — J441 Chronic obstructive pulmonary disease with (acute) exacerbation: Secondary | ICD-10-CM | POA: Diagnosis not present

## 2016-08-20 DIAGNOSIS — Z87891 Personal history of nicotine dependence: Secondary | ICD-10-CM | POA: Diagnosis not present

## 2016-08-20 DIAGNOSIS — I517 Cardiomegaly: Secondary | ICD-10-CM | POA: Diagnosis not present

## 2016-08-20 DIAGNOSIS — R509 Fever, unspecified: Secondary | ICD-10-CM | POA: Diagnosis not present

## 2016-08-20 DIAGNOSIS — I081 Rheumatic disorders of both mitral and tricuspid valves: Secondary | ICD-10-CM | POA: Diagnosis not present

## 2016-08-20 DIAGNOSIS — E785 Hyperlipidemia, unspecified: Secondary | ICD-10-CM | POA: Diagnosis not present

## 2016-08-20 DIAGNOSIS — J40 Bronchitis, not specified as acute or chronic: Secondary | ICD-10-CM | POA: Diagnosis not present

## 2016-08-20 DIAGNOSIS — I272 Pulmonary hypertension, unspecified: Secondary | ICD-10-CM | POA: Diagnosis not present

## 2016-09-03 DIAGNOSIS — J449 Chronic obstructive pulmonary disease, unspecified: Secondary | ICD-10-CM | POA: Diagnosis not present

## 2016-09-04 DIAGNOSIS — Z7982 Long term (current) use of aspirin: Secondary | ICD-10-CM | POA: Diagnosis not present

## 2016-09-04 DIAGNOSIS — Z9981 Dependence on supplemental oxygen: Secondary | ICD-10-CM | POA: Diagnosis not present

## 2016-09-04 DIAGNOSIS — J9601 Acute respiratory failure with hypoxia: Secondary | ICD-10-CM | POA: Diagnosis not present

## 2016-09-04 DIAGNOSIS — J441 Chronic obstructive pulmonary disease with (acute) exacerbation: Secondary | ICD-10-CM | POA: Diagnosis not present

## 2016-09-04 DIAGNOSIS — E785 Hyperlipidemia, unspecified: Secondary | ICD-10-CM | POA: Diagnosis not present

## 2016-09-04 DIAGNOSIS — I272 Pulmonary hypertension, unspecified: Secondary | ICD-10-CM | POA: Diagnosis not present

## 2016-09-04 DIAGNOSIS — I712 Thoracic aortic aneurysm, without rupture: Secondary | ICD-10-CM | POA: Diagnosis not present

## 2016-09-04 DIAGNOSIS — Z87891 Personal history of nicotine dependence: Secondary | ICD-10-CM | POA: Diagnosis not present

## 2016-09-04 DIAGNOSIS — R0602 Shortness of breath: Secondary | ICD-10-CM | POA: Diagnosis not present

## 2016-09-04 DIAGNOSIS — Z79899 Other long term (current) drug therapy: Secondary | ICD-10-CM | POA: Diagnosis not present

## 2016-09-04 DIAGNOSIS — J069 Acute upper respiratory infection, unspecified: Secondary | ICD-10-CM | POA: Diagnosis not present

## 2016-09-04 DIAGNOSIS — J439 Emphysema, unspecified: Secondary | ICD-10-CM | POA: Diagnosis not present

## 2016-09-04 DIAGNOSIS — I270 Primary pulmonary hypertension: Secondary | ICD-10-CM | POA: Diagnosis not present

## 2016-09-04 DIAGNOSIS — R05 Cough: Secondary | ICD-10-CM | POA: Diagnosis not present

## 2016-09-04 DIAGNOSIS — J449 Chronic obstructive pulmonary disease, unspecified: Secondary | ICD-10-CM | POA: Diagnosis not present

## 2016-10-02 DIAGNOSIS — J441 Chronic obstructive pulmonary disease with (acute) exacerbation: Secondary | ICD-10-CM | POA: Diagnosis not present

## 2016-10-02 DIAGNOSIS — J439 Emphysema, unspecified: Secondary | ICD-10-CM | POA: Diagnosis not present

## 2016-10-02 DIAGNOSIS — J40 Bronchitis, not specified as acute or chronic: Secondary | ICD-10-CM | POA: Diagnosis not present

## 2016-10-02 DIAGNOSIS — R600 Localized edema: Secondary | ICD-10-CM | POA: Diagnosis not present

## 2016-10-02 DIAGNOSIS — J9611 Chronic respiratory failure with hypoxia: Secondary | ICD-10-CM | POA: Diagnosis not present

## 2016-10-04 DIAGNOSIS — J449 Chronic obstructive pulmonary disease, unspecified: Secondary | ICD-10-CM | POA: Diagnosis not present

## 2016-10-28 DIAGNOSIS — H7292 Unspecified perforation of tympanic membrane, left ear: Secondary | ICD-10-CM | POA: Diagnosis not present

## 2016-10-28 DIAGNOSIS — J9611 Chronic respiratory failure with hypoxia: Secondary | ICD-10-CM | POA: Diagnosis not present

## 2016-10-28 DIAGNOSIS — J449 Chronic obstructive pulmonary disease, unspecified: Secondary | ICD-10-CM | POA: Diagnosis not present

## 2016-10-30 DIAGNOSIS — Z1231 Encounter for screening mammogram for malignant neoplasm of breast: Secondary | ICD-10-CM | POA: Diagnosis not present

## 2016-10-30 DIAGNOSIS — H6522 Chronic serous otitis media, left ear: Secondary | ICD-10-CM | POA: Diagnosis not present

## 2016-10-30 DIAGNOSIS — H6983 Other specified disorders of Eustachian tube, bilateral: Secondary | ICD-10-CM | POA: Diagnosis not present

## 2016-11-01 DIAGNOSIS — J449 Chronic obstructive pulmonary disease, unspecified: Secondary | ICD-10-CM | POA: Diagnosis not present

## 2016-12-02 DIAGNOSIS — J449 Chronic obstructive pulmonary disease, unspecified: Secondary | ICD-10-CM | POA: Diagnosis not present

## 2016-12-04 DIAGNOSIS — I6523 Occlusion and stenosis of bilateral carotid arteries: Secondary | ICD-10-CM | POA: Diagnosis not present

## 2016-12-04 DIAGNOSIS — I714 Abdominal aortic aneurysm, without rupture: Secondary | ICD-10-CM | POA: Diagnosis not present

## 2016-12-13 DIAGNOSIS — Z471 Aftercare following joint replacement surgery: Secondary | ICD-10-CM | POA: Diagnosis not present

## 2016-12-13 DIAGNOSIS — I272 Pulmonary hypertension, unspecified: Secondary | ICD-10-CM | POA: Diagnosis not present

## 2016-12-13 DIAGNOSIS — J810 Acute pulmonary edema: Secondary | ICD-10-CM | POA: Diagnosis not present

## 2016-12-13 DIAGNOSIS — Z7951 Long term (current) use of inhaled steroids: Secondary | ICD-10-CM | POA: Diagnosis not present

## 2016-12-13 DIAGNOSIS — S72141A Displaced intertrochanteric fracture of right femur, initial encounter for closed fracture: Secondary | ICD-10-CM | POA: Diagnosis not present

## 2016-12-13 DIAGNOSIS — N179 Acute kidney failure, unspecified: Secondary | ICD-10-CM | POA: Diagnosis not present

## 2016-12-13 DIAGNOSIS — J9600 Acute respiratory failure, unspecified whether with hypoxia or hypercapnia: Secondary | ICD-10-CM | POA: Diagnosis not present

## 2016-12-13 DIAGNOSIS — D539 Nutritional anemia, unspecified: Secondary | ICD-10-CM | POA: Diagnosis not present

## 2016-12-13 DIAGNOSIS — R9431 Abnormal electrocardiogram [ECG] [EKG]: Secondary | ICD-10-CM | POA: Diagnosis not present

## 2016-12-13 DIAGNOSIS — I259 Chronic ischemic heart disease, unspecified: Secondary | ICD-10-CM | POA: Diagnosis not present

## 2016-12-13 DIAGNOSIS — R279 Unspecified lack of coordination: Secondary | ICD-10-CM | POA: Diagnosis not present

## 2016-12-13 DIAGNOSIS — J9601 Acute respiratory failure with hypoxia: Secondary | ICD-10-CM | POA: Diagnosis not present

## 2016-12-13 DIAGNOSIS — D649 Anemia, unspecified: Secondary | ICD-10-CM | POA: Diagnosis not present

## 2016-12-13 DIAGNOSIS — R531 Weakness: Secondary | ICD-10-CM | POA: Diagnosis not present

## 2016-12-13 DIAGNOSIS — I6529 Occlusion and stenosis of unspecified carotid artery: Secondary | ICD-10-CM | POA: Diagnosis not present

## 2016-12-13 DIAGNOSIS — J9620 Acute and chronic respiratory failure, unspecified whether with hypoxia or hypercapnia: Secondary | ICD-10-CM | POA: Diagnosis not present

## 2016-12-13 DIAGNOSIS — J8410 Pulmonary fibrosis, unspecified: Secondary | ICD-10-CM | POA: Diagnosis not present

## 2016-12-13 DIAGNOSIS — M6281 Muscle weakness (generalized): Secondary | ICD-10-CM | POA: Diagnosis not present

## 2016-12-13 DIAGNOSIS — R918 Other nonspecific abnormal finding of lung field: Secondary | ICD-10-CM | POA: Diagnosis not present

## 2016-12-13 DIAGNOSIS — Z9981 Dependence on supplemental oxygen: Secondary | ICD-10-CM | POA: Diagnosis not present

## 2016-12-13 DIAGNOSIS — R269 Unspecified abnormalities of gait and mobility: Secondary | ICD-10-CM | POA: Diagnosis not present

## 2016-12-13 DIAGNOSIS — S72001A Fracture of unspecified part of neck of right femur, initial encounter for closed fracture: Secondary | ICD-10-CM | POA: Diagnosis not present

## 2016-12-13 DIAGNOSIS — J439 Emphysema, unspecified: Secondary | ICD-10-CM | POA: Diagnosis not present

## 2016-12-13 DIAGNOSIS — I739 Peripheral vascular disease, unspecified: Secondary | ICD-10-CM | POA: Diagnosis not present

## 2016-12-13 DIAGNOSIS — I712 Thoracic aortic aneurysm, without rupture: Secondary | ICD-10-CM | POA: Diagnosis not present

## 2016-12-13 DIAGNOSIS — Z9889 Other specified postprocedural states: Secondary | ICD-10-CM | POA: Diagnosis not present

## 2016-12-13 DIAGNOSIS — S72091A Other fracture of head and neck of right femur, initial encounter for closed fracture: Secondary | ICD-10-CM | POA: Diagnosis not present

## 2016-12-13 DIAGNOSIS — S72011A Unspecified intracapsular fracture of right femur, initial encounter for closed fracture: Secondary | ICD-10-CM | POA: Diagnosis not present

## 2016-12-13 DIAGNOSIS — R6510 Systemic inflammatory response syndrome (SIRS) of non-infectious origin without acute organ dysfunction: Secondary | ICD-10-CM | POA: Diagnosis not present

## 2016-12-13 DIAGNOSIS — Z960 Presence of urogenital implants: Secondary | ICD-10-CM | POA: Diagnosis not present

## 2016-12-13 DIAGNOSIS — J80 Acute respiratory distress syndrome: Secondary | ICD-10-CM | POA: Diagnosis not present

## 2016-12-13 DIAGNOSIS — Z87891 Personal history of nicotine dependence: Secondary | ICD-10-CM | POA: Diagnosis not present

## 2016-12-13 DIAGNOSIS — M25551 Pain in right hip: Secondary | ICD-10-CM | POA: Diagnosis not present

## 2016-12-13 DIAGNOSIS — S92153A Displaced avulsion fracture (chip fracture) of unspecified talus, initial encounter for closed fracture: Secondary | ICD-10-CM | POA: Diagnosis not present

## 2016-12-13 DIAGNOSIS — M25531 Pain in right wrist: Secondary | ICD-10-CM | POA: Diagnosis not present

## 2016-12-13 DIAGNOSIS — E785 Hyperlipidemia, unspecified: Secondary | ICD-10-CM | POA: Diagnosis not present

## 2016-12-13 DIAGNOSIS — M79604 Pain in right leg: Secondary | ICD-10-CM | POA: Diagnosis not present

## 2016-12-13 DIAGNOSIS — I6523 Occlusion and stenosis of bilateral carotid arteries: Secondary | ICD-10-CM | POA: Diagnosis not present

## 2016-12-13 DIAGNOSIS — J811 Chronic pulmonary edema: Secondary | ICD-10-CM | POA: Diagnosis not present

## 2016-12-13 DIAGNOSIS — M79641 Pain in right hand: Secondary | ICD-10-CM | POA: Diagnosis not present

## 2016-12-13 DIAGNOSIS — Z5189 Encounter for other specified aftercare: Secondary | ICD-10-CM | POA: Diagnosis not present

## 2016-12-13 DIAGNOSIS — J9621 Acute and chronic respiratory failure with hypoxia: Secondary | ICD-10-CM | POA: Diagnosis not present

## 2016-12-13 DIAGNOSIS — R6511 Systemic inflammatory response syndrome (SIRS) of non-infectious origin with acute organ dysfunction: Secondary | ICD-10-CM | POA: Diagnosis not present

## 2016-12-13 DIAGNOSIS — Z96641 Presence of right artificial hip joint: Secondary | ICD-10-CM | POA: Diagnosis not present

## 2016-12-13 DIAGNOSIS — J9611 Chronic respiratory failure with hypoxia: Secondary | ICD-10-CM | POA: Diagnosis not present

## 2016-12-13 DIAGNOSIS — Z7952 Long term (current) use of systemic steroids: Secondary | ICD-10-CM | POA: Diagnosis not present

## 2016-12-13 DIAGNOSIS — L121 Cicatricial pemphigoid: Secondary | ICD-10-CM | POA: Diagnosis not present

## 2016-12-13 DIAGNOSIS — Z7982 Long term (current) use of aspirin: Secondary | ICD-10-CM | POA: Diagnosis not present

## 2016-12-13 DIAGNOSIS — J449 Chronic obstructive pulmonary disease, unspecified: Secondary | ICD-10-CM | POA: Diagnosis not present

## 2016-12-13 DIAGNOSIS — M109 Gout, unspecified: Secondary | ICD-10-CM | POA: Diagnosis not present

## 2016-12-13 DIAGNOSIS — Z79899 Other long term (current) drug therapy: Secondary | ICD-10-CM | POA: Diagnosis not present

## 2016-12-13 DIAGNOSIS — W19XXXA Unspecified fall, initial encounter: Secondary | ICD-10-CM | POA: Diagnosis not present

## 2016-12-13 DIAGNOSIS — M25521 Pain in right elbow: Secondary | ICD-10-CM | POA: Diagnosis not present

## 2016-12-13 DIAGNOSIS — M85851 Other specified disorders of bone density and structure, right thigh: Secondary | ICD-10-CM | POA: Diagnosis not present

## 2016-12-13 DIAGNOSIS — J441 Chronic obstructive pulmonary disease with (acute) exacerbation: Secondary | ICD-10-CM | POA: Diagnosis not present

## 2016-12-30 DIAGNOSIS — J9611 Chronic respiratory failure with hypoxia: Secondary | ICD-10-CM | POA: Diagnosis not present

## 2016-12-30 DIAGNOSIS — J439 Emphysema, unspecified: Secondary | ICD-10-CM | POA: Diagnosis not present

## 2016-12-30 DIAGNOSIS — Z96641 Presence of right artificial hip joint: Secondary | ICD-10-CM | POA: Diagnosis not present

## 2016-12-30 DIAGNOSIS — R0602 Shortness of breath: Secondary | ICD-10-CM | POA: Diagnosis not present

## 2016-12-30 DIAGNOSIS — S92153A Displaced avulsion fracture (chip fracture) of unspecified talus, initial encounter for closed fracture: Secondary | ICD-10-CM | POA: Diagnosis not present

## 2016-12-30 DIAGNOSIS — M6281 Muscle weakness (generalized): Secondary | ICD-10-CM | POA: Diagnosis not present

## 2016-12-30 DIAGNOSIS — R0600 Dyspnea, unspecified: Secondary | ICD-10-CM | POA: Diagnosis not present

## 2016-12-30 DIAGNOSIS — R269 Unspecified abnormalities of gait and mobility: Secondary | ICD-10-CM | POA: Diagnosis not present

## 2016-12-30 DIAGNOSIS — J449 Chronic obstructive pulmonary disease, unspecified: Secondary | ICD-10-CM | POA: Diagnosis not present

## 2016-12-30 DIAGNOSIS — E785 Hyperlipidemia, unspecified: Secondary | ICD-10-CM | POA: Diagnosis not present

## 2016-12-30 DIAGNOSIS — Z9981 Dependence on supplemental oxygen: Secondary | ICD-10-CM | POA: Diagnosis not present

## 2016-12-30 DIAGNOSIS — I272 Pulmonary hypertension, unspecified: Secondary | ICD-10-CM | POA: Diagnosis not present

## 2016-12-30 DIAGNOSIS — R05 Cough: Secondary | ICD-10-CM | POA: Diagnosis not present

## 2016-12-30 DIAGNOSIS — I6529 Occlusion and stenosis of unspecified carotid artery: Secondary | ICD-10-CM | POA: Diagnosis not present

## 2016-12-30 DIAGNOSIS — M109 Gout, unspecified: Secondary | ICD-10-CM | POA: Diagnosis not present

## 2016-12-30 DIAGNOSIS — Z5189 Encounter for other specified aftercare: Secondary | ICD-10-CM | POA: Diagnosis not present

## 2016-12-30 DIAGNOSIS — Z9889 Other specified postprocedural states: Secondary | ICD-10-CM | POA: Diagnosis not present

## 2016-12-30 DIAGNOSIS — J9621 Acute and chronic respiratory failure with hypoxia: Secondary | ICD-10-CM | POA: Diagnosis not present

## 2016-12-30 DIAGNOSIS — I712 Thoracic aortic aneurysm, without rupture: Secondary | ICD-10-CM | POA: Diagnosis not present

## 2016-12-30 DIAGNOSIS — Z471 Aftercare following joint replacement surgery: Secondary | ICD-10-CM | POA: Diagnosis not present

## 2016-12-30 DIAGNOSIS — R279 Unspecified lack of coordination: Secondary | ICD-10-CM | POA: Diagnosis not present

## 2016-12-30 DIAGNOSIS — J9600 Acute respiratory failure, unspecified whether with hypoxia or hypercapnia: Secondary | ICD-10-CM | POA: Diagnosis not present

## 2017-01-01 DIAGNOSIS — J9621 Acute and chronic respiratory failure with hypoxia: Secondary | ICD-10-CM | POA: Diagnosis not present

## 2017-01-01 DIAGNOSIS — E785 Hyperlipidemia, unspecified: Secondary | ICD-10-CM | POA: Diagnosis not present

## 2017-01-01 DIAGNOSIS — Z96641 Presence of right artificial hip joint: Secondary | ICD-10-CM | POA: Diagnosis not present

## 2017-01-01 DIAGNOSIS — J439 Emphysema, unspecified: Secondary | ICD-10-CM | POA: Diagnosis not present

## 2017-01-08 DIAGNOSIS — R05 Cough: Secondary | ICD-10-CM | POA: Diagnosis not present

## 2017-01-08 DIAGNOSIS — R0602 Shortness of breath: Secondary | ICD-10-CM | POA: Diagnosis not present

## 2017-01-08 DIAGNOSIS — R0600 Dyspnea, unspecified: Secondary | ICD-10-CM | POA: Diagnosis not present

## 2017-01-12 DIAGNOSIS — M6281 Muscle weakness (generalized): Secondary | ICD-10-CM | POA: Diagnosis not present

## 2017-01-12 DIAGNOSIS — Z471 Aftercare following joint replacement surgery: Secondary | ICD-10-CM | POA: Diagnosis not present

## 2017-01-12 DIAGNOSIS — Z96641 Presence of right artificial hip joint: Secondary | ICD-10-CM | POA: Diagnosis not present

## 2017-01-12 DIAGNOSIS — J449 Chronic obstructive pulmonary disease, unspecified: Secondary | ICD-10-CM | POA: Diagnosis not present

## 2017-01-12 DIAGNOSIS — R269 Unspecified abnormalities of gait and mobility: Secondary | ICD-10-CM | POA: Diagnosis not present

## 2017-01-13 DIAGNOSIS — E785 Hyperlipidemia, unspecified: Secondary | ICD-10-CM | POA: Diagnosis not present

## 2017-01-13 DIAGNOSIS — E876 Hypokalemia: Secondary | ICD-10-CM | POA: Diagnosis not present

## 2017-01-13 DIAGNOSIS — J189 Pneumonia, unspecified organism: Secondary | ICD-10-CM | POA: Diagnosis not present

## 2017-01-13 DIAGNOSIS — Z7951 Long term (current) use of inhaled steroids: Secondary | ICD-10-CM | POA: Diagnosis not present

## 2017-01-13 DIAGNOSIS — Z79899 Other long term (current) drug therapy: Secondary | ICD-10-CM | POA: Diagnosis not present

## 2017-01-13 DIAGNOSIS — Z7982 Long term (current) use of aspirin: Secondary | ICD-10-CM | POA: Diagnosis not present

## 2017-01-13 DIAGNOSIS — J449 Chronic obstructive pulmonary disease, unspecified: Secondary | ICD-10-CM | POA: Diagnosis not present

## 2017-01-13 DIAGNOSIS — J440 Chronic obstructive pulmonary disease with acute lower respiratory infection: Secondary | ICD-10-CM | POA: Diagnosis not present

## 2017-01-13 DIAGNOSIS — S72001D Fracture of unspecified part of neck of right femur, subsequent encounter for closed fracture with routine healing: Secondary | ICD-10-CM | POA: Diagnosis not present

## 2017-01-13 DIAGNOSIS — I2723 Pulmonary hypertension due to lung diseases and hypoxia: Secondary | ICD-10-CM | POA: Diagnosis not present

## 2017-01-13 DIAGNOSIS — J9621 Acute and chronic respiratory failure with hypoxia: Secondary | ICD-10-CM | POA: Diagnosis not present

## 2017-01-13 DIAGNOSIS — J439 Emphysema, unspecified: Secondary | ICD-10-CM | POA: Diagnosis not present

## 2017-01-13 DIAGNOSIS — I272 Pulmonary hypertension, unspecified: Secondary | ICD-10-CM | POA: Diagnosis not present

## 2017-01-13 DIAGNOSIS — R05 Cough: Secondary | ICD-10-CM | POA: Diagnosis not present

## 2017-01-13 DIAGNOSIS — M109 Gout, unspecified: Secondary | ICD-10-CM | POA: Diagnosis not present

## 2017-01-13 DIAGNOSIS — I712 Thoracic aortic aneurysm, without rupture: Secondary | ICD-10-CM | POA: Diagnosis not present

## 2017-01-13 DIAGNOSIS — Z87891 Personal history of nicotine dependence: Secondary | ICD-10-CM | POA: Diagnosis not present

## 2017-01-13 DIAGNOSIS — R0602 Shortness of breath: Secondary | ICD-10-CM | POA: Diagnosis not present

## 2017-01-20 DIAGNOSIS — J449 Chronic obstructive pulmonary disease, unspecified: Secondary | ICD-10-CM | POA: Diagnosis not present

## 2017-01-20 DIAGNOSIS — R269 Unspecified abnormalities of gait and mobility: Secondary | ICD-10-CM | POA: Diagnosis not present

## 2017-01-20 DIAGNOSIS — Z471 Aftercare following joint replacement surgery: Secondary | ICD-10-CM | POA: Diagnosis not present

## 2017-01-20 DIAGNOSIS — M6281 Muscle weakness (generalized): Secondary | ICD-10-CM | POA: Diagnosis not present

## 2017-01-20 DIAGNOSIS — Z96641 Presence of right artificial hip joint: Secondary | ICD-10-CM | POA: Diagnosis not present

## 2017-01-21 DIAGNOSIS — R269 Unspecified abnormalities of gait and mobility: Secondary | ICD-10-CM | POA: Diagnosis not present

## 2017-01-21 DIAGNOSIS — Z96641 Presence of right artificial hip joint: Secondary | ICD-10-CM | POA: Diagnosis not present

## 2017-01-21 DIAGNOSIS — Z471 Aftercare following joint replacement surgery: Secondary | ICD-10-CM | POA: Diagnosis not present

## 2017-01-21 DIAGNOSIS — M6281 Muscle weakness (generalized): Secondary | ICD-10-CM | POA: Diagnosis not present

## 2017-01-21 DIAGNOSIS — J449 Chronic obstructive pulmonary disease, unspecified: Secondary | ICD-10-CM | POA: Diagnosis not present

## 2017-01-22 DIAGNOSIS — M6281 Muscle weakness (generalized): Secondary | ICD-10-CM | POA: Diagnosis not present

## 2017-01-22 DIAGNOSIS — Z471 Aftercare following joint replacement surgery: Secondary | ICD-10-CM | POA: Diagnosis not present

## 2017-01-22 DIAGNOSIS — Z96641 Presence of right artificial hip joint: Secondary | ICD-10-CM | POA: Diagnosis not present

## 2017-01-22 DIAGNOSIS — J449 Chronic obstructive pulmonary disease, unspecified: Secondary | ICD-10-CM | POA: Diagnosis not present

## 2017-01-22 DIAGNOSIS — R269 Unspecified abnormalities of gait and mobility: Secondary | ICD-10-CM | POA: Diagnosis not present

## 2017-01-23 DIAGNOSIS — M6281 Muscle weakness (generalized): Secondary | ICD-10-CM | POA: Diagnosis not present

## 2017-01-23 DIAGNOSIS — Z471 Aftercare following joint replacement surgery: Secondary | ICD-10-CM | POA: Diagnosis not present

## 2017-01-23 DIAGNOSIS — R269 Unspecified abnormalities of gait and mobility: Secondary | ICD-10-CM | POA: Diagnosis not present

## 2017-01-23 DIAGNOSIS — Z96641 Presence of right artificial hip joint: Secondary | ICD-10-CM | POA: Diagnosis not present

## 2017-01-23 DIAGNOSIS — J449 Chronic obstructive pulmonary disease, unspecified: Secondary | ICD-10-CM | POA: Diagnosis not present

## 2017-01-27 DIAGNOSIS — R269 Unspecified abnormalities of gait and mobility: Secondary | ICD-10-CM | POA: Diagnosis not present

## 2017-01-27 DIAGNOSIS — J449 Chronic obstructive pulmonary disease, unspecified: Secondary | ICD-10-CM | POA: Diagnosis not present

## 2017-01-27 DIAGNOSIS — Z471 Aftercare following joint replacement surgery: Secondary | ICD-10-CM | POA: Diagnosis not present

## 2017-01-27 DIAGNOSIS — Z96641 Presence of right artificial hip joint: Secondary | ICD-10-CM | POA: Diagnosis not present

## 2017-01-27 DIAGNOSIS — M6281 Muscle weakness (generalized): Secondary | ICD-10-CM | POA: Diagnosis not present

## 2017-01-28 ENCOUNTER — Other Ambulatory Visit: Payer: Self-pay | Admitting: *Deleted

## 2017-01-28 NOTE — Patient Outreach (Addendum)
[facility]) Care Management  01/28/2017  Elizabeth Melendez 19-Oct-1945 [id number]   RN spoke with pt today and introduced the [facility] services and purpose for today's call. Pt states she has [facility] and does not wish to have additional services at this time. RN attempted to explain [facility] does not interfere with the current [facility] services but pt again adamant about declining [facility] services. [facility] explained the referral and possible services however pt interrupted and indicated she recently changed providers from Dr. Dyane Dustman to another provider that is not a [facility] provider but did not wish to disclose this information at this time. Pt mentioned issues with [facility] in the past however did not wish to discuss details or intervene with any other actions on her complaints. Pt again opted to decline [facility] services and does not wish to have any additional follow up related to future services. RN extended gratitude for pt's time today and ended the call based upon pt's request. Will update CMA on pt's option to decline [facility] services. Note no provided listed for decline of services. Will close case based upon pt's request.  Raina Mina, RN Care Management Coordinator [place] Office [phone]

## 2017-01-29 DIAGNOSIS — M6281 Muscle weakness (generalized): Secondary | ICD-10-CM | POA: Diagnosis not present

## 2017-01-29 DIAGNOSIS — Z471 Aftercare following joint replacement surgery: Secondary | ICD-10-CM | POA: Diagnosis not present

## 2017-01-29 DIAGNOSIS — J449 Chronic obstructive pulmonary disease, unspecified: Secondary | ICD-10-CM | POA: Diagnosis not present

## 2017-01-29 DIAGNOSIS — R269 Unspecified abnormalities of gait and mobility: Secondary | ICD-10-CM | POA: Diagnosis not present

## 2017-01-29 DIAGNOSIS — Z96641 Presence of right artificial hip joint: Secondary | ICD-10-CM | POA: Diagnosis not present

## 2017-01-30 DIAGNOSIS — Z96641 Presence of right artificial hip joint: Secondary | ICD-10-CM | POA: Diagnosis not present

## 2017-01-30 DIAGNOSIS — J449 Chronic obstructive pulmonary disease, unspecified: Secondary | ICD-10-CM | POA: Diagnosis not present

## 2017-01-30 DIAGNOSIS — Z471 Aftercare following joint replacement surgery: Secondary | ICD-10-CM | POA: Diagnosis not present

## 2017-01-30 DIAGNOSIS — R269 Unspecified abnormalities of gait and mobility: Secondary | ICD-10-CM | POA: Diagnosis not present

## 2017-01-30 DIAGNOSIS — M6281 Muscle weakness (generalized): Secondary | ICD-10-CM | POA: Diagnosis not present

## 2017-02-01 DIAGNOSIS — J449 Chronic obstructive pulmonary disease, unspecified: Secondary | ICD-10-CM | POA: Diagnosis not present

## 2017-02-03 DIAGNOSIS — R05 Cough: Secondary | ICD-10-CM | POA: Diagnosis not present

## 2017-02-03 DIAGNOSIS — R918 Other nonspecific abnormal finding of lung field: Secondary | ICD-10-CM | POA: Diagnosis not present

## 2017-02-03 DIAGNOSIS — Z1231 Encounter for screening mammogram for malignant neoplasm of breast: Secondary | ICD-10-CM | POA: Diagnosis not present

## 2017-02-03 DIAGNOSIS — I6523 Occlusion and stenosis of bilateral carotid arteries: Secondary | ICD-10-CM | POA: Diagnosis not present

## 2017-02-03 DIAGNOSIS — R509 Fever, unspecified: Secondary | ICD-10-CM | POA: Diagnosis not present

## 2017-02-03 DIAGNOSIS — J449 Chronic obstructive pulmonary disease, unspecified: Secondary | ICD-10-CM | POA: Diagnosis not present

## 2017-02-04 DIAGNOSIS — J449 Chronic obstructive pulmonary disease, unspecified: Secondary | ICD-10-CM | POA: Diagnosis not present

## 2017-02-04 DIAGNOSIS — Z96641 Presence of right artificial hip joint: Secondary | ICD-10-CM | POA: Diagnosis not present

## 2017-02-04 DIAGNOSIS — M6281 Muscle weakness (generalized): Secondary | ICD-10-CM | POA: Diagnosis not present

## 2017-02-04 DIAGNOSIS — Z471 Aftercare following joint replacement surgery: Secondary | ICD-10-CM | POA: Diagnosis not present

## 2017-02-04 DIAGNOSIS — R269 Unspecified abnormalities of gait and mobility: Secondary | ICD-10-CM | POA: Diagnosis not present

## 2017-02-05 DIAGNOSIS — Z471 Aftercare following joint replacement surgery: Secondary | ICD-10-CM | POA: Diagnosis not present

## 2017-02-05 DIAGNOSIS — Z96641 Presence of right artificial hip joint: Secondary | ICD-10-CM | POA: Diagnosis not present

## 2017-02-06 DIAGNOSIS — Z471 Aftercare following joint replacement surgery: Secondary | ICD-10-CM | POA: Diagnosis not present

## 2017-02-06 DIAGNOSIS — J449 Chronic obstructive pulmonary disease, unspecified: Secondary | ICD-10-CM | POA: Diagnosis not present

## 2017-02-06 DIAGNOSIS — Z96641 Presence of right artificial hip joint: Secondary | ICD-10-CM | POA: Diagnosis not present

## 2017-02-06 DIAGNOSIS — M6281 Muscle weakness (generalized): Secondary | ICD-10-CM | POA: Diagnosis not present

## 2017-02-06 DIAGNOSIS — R269 Unspecified abnormalities of gait and mobility: Secondary | ICD-10-CM | POA: Diagnosis not present

## 2017-02-07 DIAGNOSIS — J449 Chronic obstructive pulmonary disease, unspecified: Secondary | ICD-10-CM | POA: Diagnosis not present

## 2017-02-07 DIAGNOSIS — Z96641 Presence of right artificial hip joint: Secondary | ICD-10-CM | POA: Diagnosis not present

## 2017-02-07 DIAGNOSIS — Z471 Aftercare following joint replacement surgery: Secondary | ICD-10-CM | POA: Diagnosis not present

## 2017-02-07 DIAGNOSIS — M6281 Muscle weakness (generalized): Secondary | ICD-10-CM | POA: Diagnosis not present

## 2017-02-07 DIAGNOSIS — R269 Unspecified abnormalities of gait and mobility: Secondary | ICD-10-CM | POA: Diagnosis not present

## 2017-02-09 DIAGNOSIS — J9621 Acute and chronic respiratory failure with hypoxia: Secondary | ICD-10-CM | POA: Diagnosis not present

## 2017-02-09 DIAGNOSIS — J849 Interstitial pulmonary disease, unspecified: Secondary | ICD-10-CM | POA: Diagnosis not present

## 2017-02-09 DIAGNOSIS — I081 Rheumatic disorders of both mitral and tricuspid valves: Secondary | ICD-10-CM | POA: Diagnosis not present

## 2017-02-09 DIAGNOSIS — J439 Emphysema, unspecified: Secondary | ICD-10-CM | POA: Diagnosis not present

## 2017-02-09 DIAGNOSIS — R600 Localized edema: Secondary | ICD-10-CM | POA: Diagnosis not present

## 2017-02-09 DIAGNOSIS — G4730 Sleep apnea, unspecified: Secondary | ICD-10-CM | POA: Diagnosis not present

## 2017-02-09 DIAGNOSIS — I2722 Pulmonary hypertension due to left heart disease: Secondary | ICD-10-CM | POA: Diagnosis not present

## 2017-02-09 DIAGNOSIS — J9601 Acute respiratory failure with hypoxia: Secondary | ICD-10-CM | POA: Diagnosis not present

## 2017-02-09 DIAGNOSIS — Z87891 Personal history of nicotine dependence: Secondary | ICD-10-CM | POA: Diagnosis not present

## 2017-02-09 DIAGNOSIS — E785 Hyperlipidemia, unspecified: Secondary | ICD-10-CM | POA: Diagnosis not present

## 2017-02-09 DIAGNOSIS — R0902 Hypoxemia: Secondary | ICD-10-CM | POA: Diagnosis not present

## 2017-02-09 DIAGNOSIS — J9611 Chronic respiratory failure with hypoxia: Secondary | ICD-10-CM | POA: Diagnosis not present

## 2017-02-09 DIAGNOSIS — M6281 Muscle weakness (generalized): Secondary | ICD-10-CM | POA: Diagnosis not present

## 2017-02-09 DIAGNOSIS — R0602 Shortness of breath: Secondary | ICD-10-CM | POA: Diagnosis not present

## 2017-02-09 DIAGNOSIS — R509 Fever, unspecified: Secondary | ICD-10-CM | POA: Diagnosis not present

## 2017-02-09 DIAGNOSIS — J441 Chronic obstructive pulmonary disease with (acute) exacerbation: Secondary | ICD-10-CM | POA: Diagnosis not present

## 2017-02-09 DIAGNOSIS — I272 Pulmonary hypertension, unspecified: Secondary | ICD-10-CM | POA: Diagnosis not present

## 2017-02-09 DIAGNOSIS — J449 Chronic obstructive pulmonary disease, unspecified: Secondary | ICD-10-CM | POA: Diagnosis not present

## 2017-02-09 DIAGNOSIS — J9600 Acute respiratory failure, unspecified whether with hypoxia or hypercapnia: Secondary | ICD-10-CM | POA: Diagnosis not present

## 2017-02-24 DIAGNOSIS — J449 Chronic obstructive pulmonary disease, unspecified: Secondary | ICD-10-CM | POA: Diagnosis not present

## 2017-02-24 DIAGNOSIS — Z96641 Presence of right artificial hip joint: Secondary | ICD-10-CM | POA: Diagnosis not present

## 2017-02-24 DIAGNOSIS — Z471 Aftercare following joint replacement surgery: Secondary | ICD-10-CM | POA: Diagnosis not present

## 2017-02-24 DIAGNOSIS — M6281 Muscle weakness (generalized): Secondary | ICD-10-CM | POA: Diagnosis not present

## 2017-02-24 DIAGNOSIS — R269 Unspecified abnormalities of gait and mobility: Secondary | ICD-10-CM | POA: Diagnosis not present

## 2017-02-26 DIAGNOSIS — J449 Chronic obstructive pulmonary disease, unspecified: Secondary | ICD-10-CM | POA: Diagnosis not present

## 2017-02-26 DIAGNOSIS — M6281 Muscle weakness (generalized): Secondary | ICD-10-CM | POA: Diagnosis not present

## 2017-02-26 DIAGNOSIS — Z96641 Presence of right artificial hip joint: Secondary | ICD-10-CM | POA: Diagnosis not present

## 2017-02-26 DIAGNOSIS — Z471 Aftercare following joint replacement surgery: Secondary | ICD-10-CM | POA: Diagnosis not present

## 2017-02-26 DIAGNOSIS — R269 Unspecified abnormalities of gait and mobility: Secondary | ICD-10-CM | POA: Diagnosis not present

## 2017-02-27 DIAGNOSIS — Z96641 Presence of right artificial hip joint: Secondary | ICD-10-CM | POA: Diagnosis not present

## 2017-02-27 DIAGNOSIS — Z471 Aftercare following joint replacement surgery: Secondary | ICD-10-CM | POA: Diagnosis not present

## 2017-02-27 DIAGNOSIS — J449 Chronic obstructive pulmonary disease, unspecified: Secondary | ICD-10-CM | POA: Diagnosis not present

## 2017-02-27 DIAGNOSIS — R269 Unspecified abnormalities of gait and mobility: Secondary | ICD-10-CM | POA: Diagnosis not present

## 2017-02-27 DIAGNOSIS — M6281 Muscle weakness (generalized): Secondary | ICD-10-CM | POA: Diagnosis not present

## 2018-03-01 DEATH — deceased

## 2018-04-01 IMAGING — DX DG ABDOMEN 2V
3 series · 3 of 3 positions shown · non-contrast
Comparison: Chest x-ray 09/04/2015

CLINICAL DATA: Abdominal pain

EXAM:
ABDOMEN - 2 VIEW

[abdomen erect]
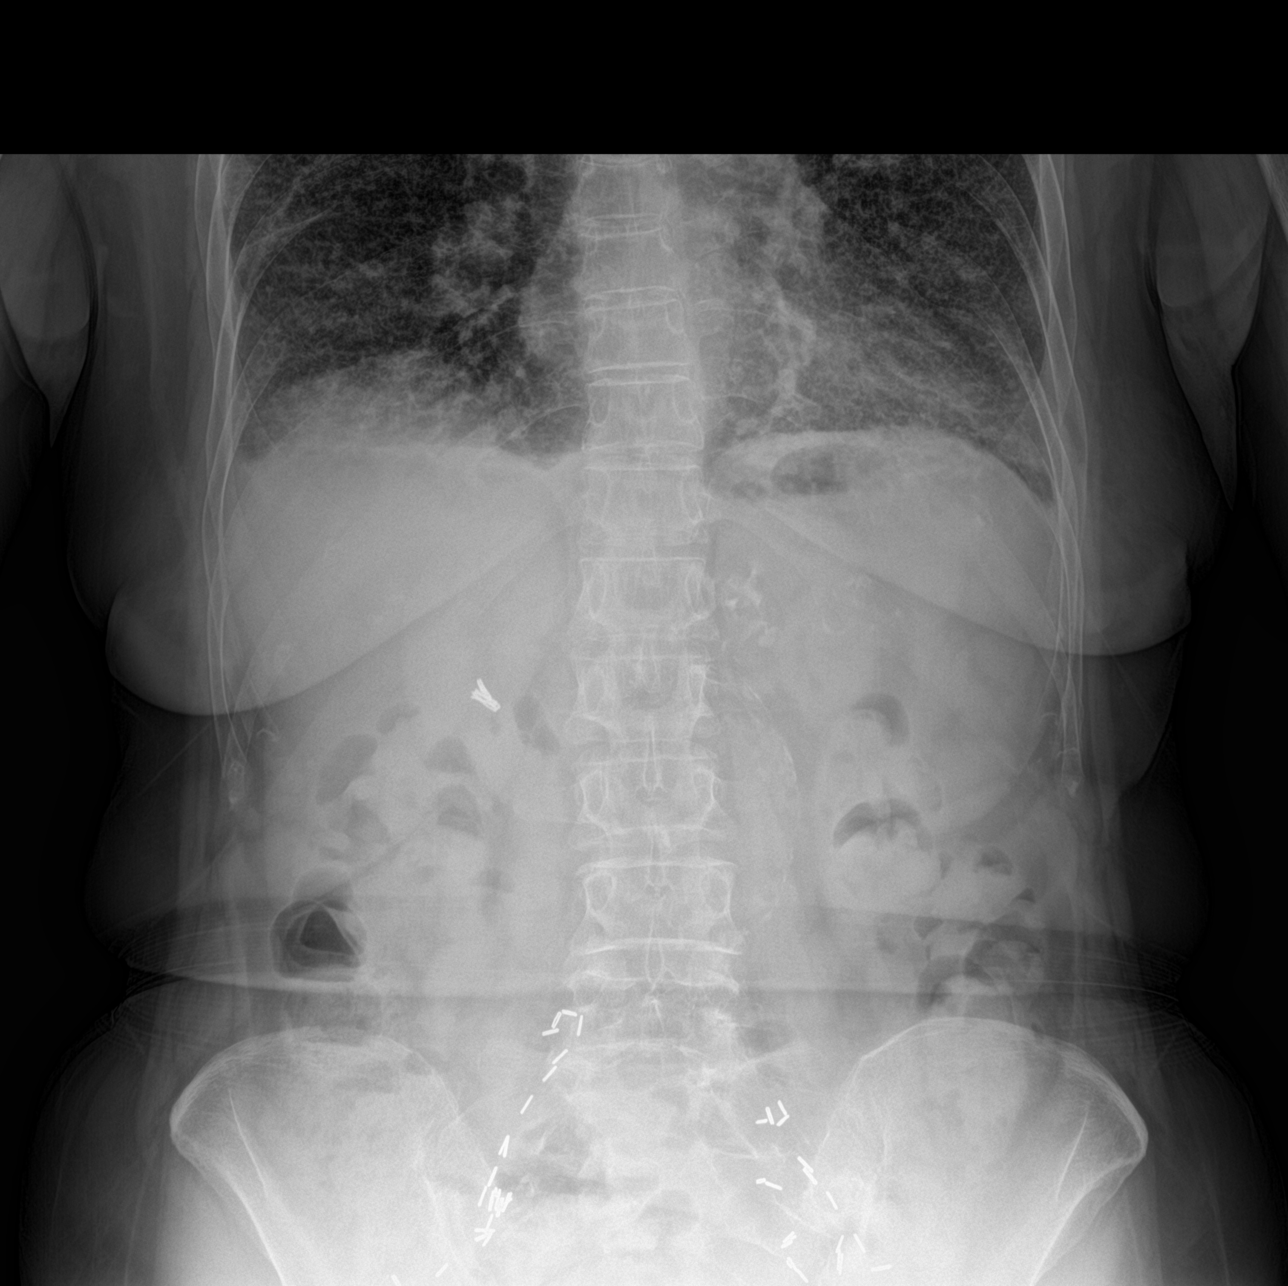

[abdomen supine (1 of 2)]
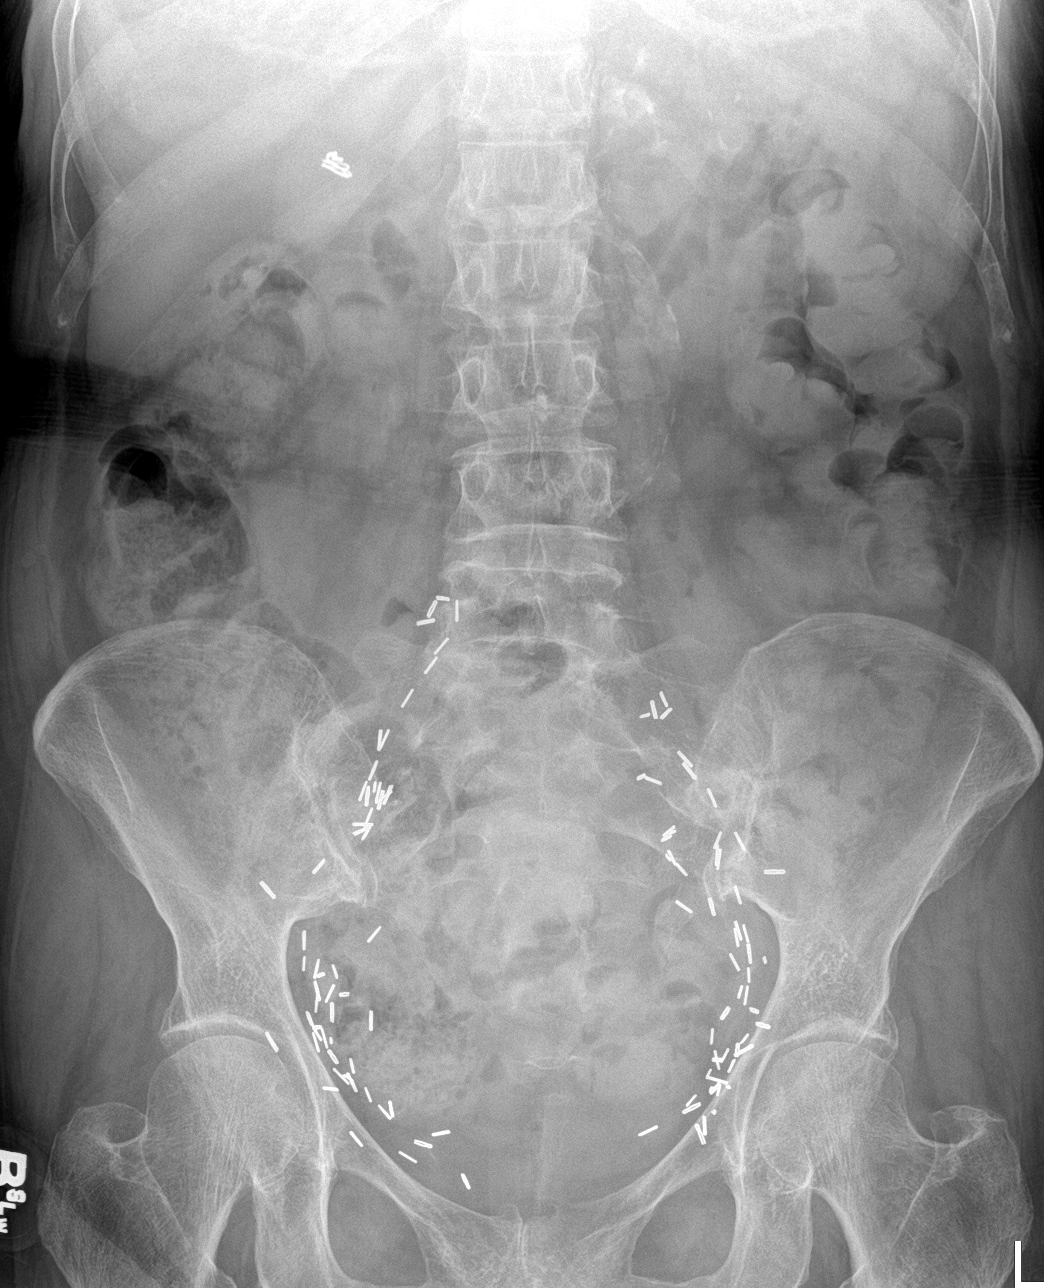

[abdomen supine (2 of 2)]
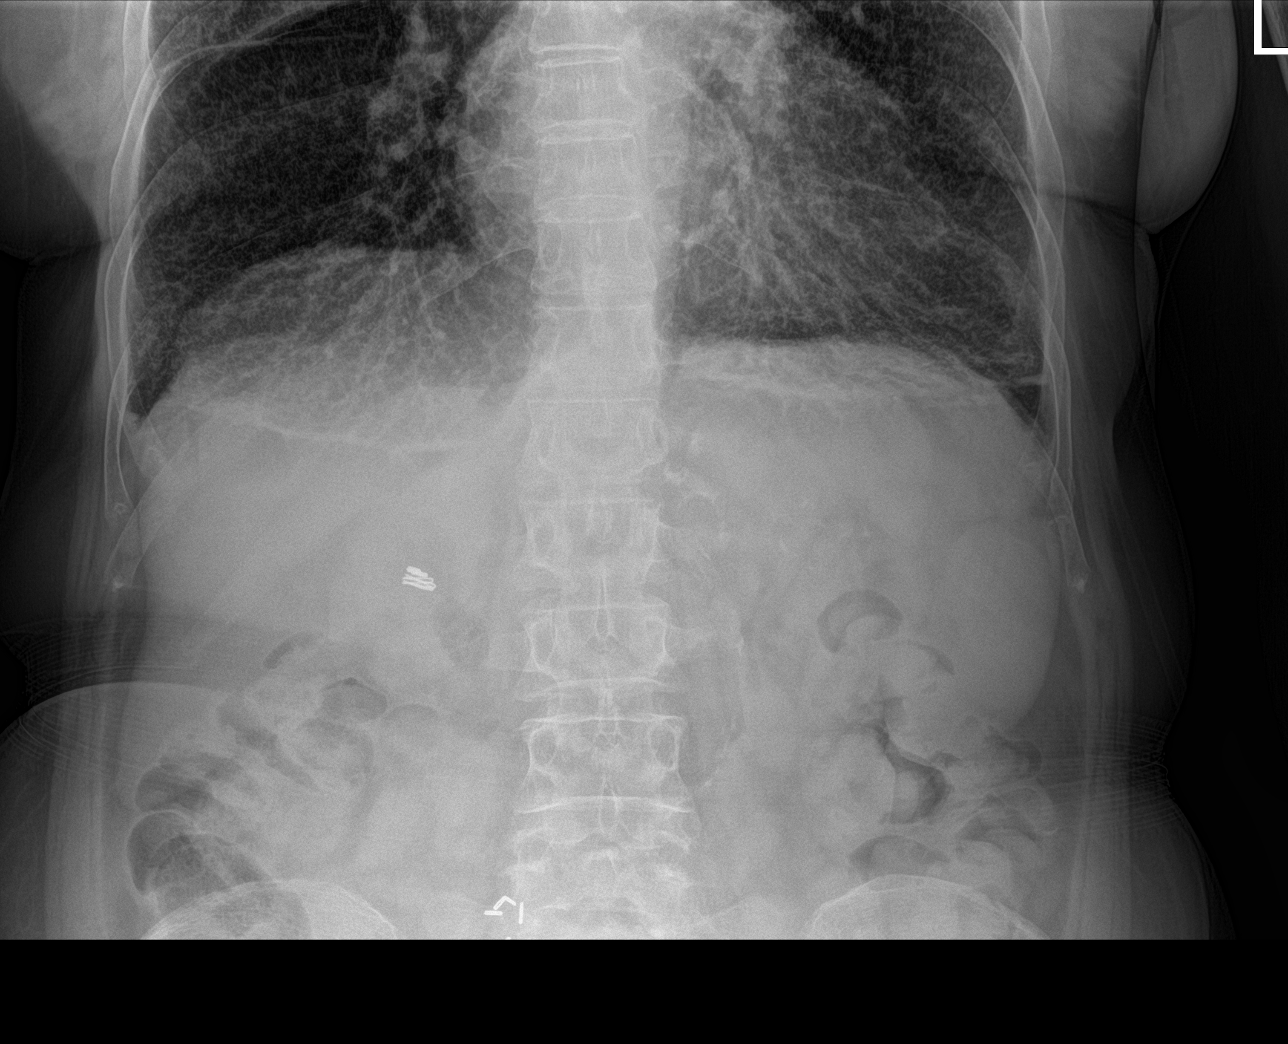

[3 of 3 positions shown; findings below may reference images not displayed]

FINDINGS: Normal bowel gas pattern. No bowel obstruction or ileus. Moderate
retained stool in the colon. No free air.

Atherosclerotic abdominal aorta. Aneurysm cannot be excluded. The
right wall of the aorta is not visualized.

Bilateral iliac lymph node dissection with multiple surgical clips
bilaterally. No acute skeletal abnormality. Cholecystectomy

Increased lung markings throughout the basis which has progressed
since the prior study. Possible interstitial edema or pneumonia.
Follow-up chest x-ray recommended
IMPRESSION: Nonobstructive bowel gas pattern. Moderate retained stool in the
colon

Calcified abdominal aorta. Cannot rule out abdominal aortic
aneurysm. Recommend ultrasound of the aorta.

Progression of prominent lung markings in the lung bases bilaterally
possibly due to pneumonia or edema. Recommend chest two views for
follow-up.
